# Patient Record
Sex: Female | Born: 1975 | Race: Asian | Hispanic: No | Marital: Married | Smoking: Never smoker
Health system: Southern US, Community
[De-identification: ages and names within clinical notes are randomized; demographics above are authoritative.]

---

## 2016-07-11 ENCOUNTER — Encounter: Payer: Self-pay | Admitting: *Deleted

## 2016-07-11 ENCOUNTER — Emergency Department (INDEPENDENT_AMBULATORY_CARE_PROVIDER_SITE_OTHER): Payer: Self-pay

## 2016-07-11 ENCOUNTER — Emergency Department
Admission: EM | Admit: 2016-07-11 | Discharge: 2016-07-11 | Disposition: A | Discharge status: Home / Self Care | Payer: Self-pay | Source: Home / Self Care | Attending: Family Medicine | Admitting: Family Medicine

## 2016-07-11 DIAGNOSIS — M25571 Pain in right ankle and joints of right foot: Secondary | ICD-10-CM

## 2016-07-11 DIAGNOSIS — S93601A Unspecified sprain of right foot, initial encounter: Secondary | ICD-10-CM

## 2016-07-11 DIAGNOSIS — M79671 Pain in right foot: Secondary | ICD-10-CM

## 2016-07-11 DIAGNOSIS — S93401A Sprain of unspecified ligament of right ankle, initial encounter: Secondary | ICD-10-CM

## 2016-07-11 NOTE — ED Provider Notes (Signed)
[facility]    CSN: [id number] Arrival date & time: 07/11/16  1056  First Provider Contact:  None       History   Chief Complaint Chief Complaint  Patient presents with  . Ankle Pain  . Foot Pain    HPI Kayla Brady is a 40 y.o. female.   Patient was walking down stairs about 3 hours ago when she missed the bottom stair and inverted her right foot/ankle.  She heard and felt a popping sound, and now has pain with weight-bearing   The history is provided by the patient.  Ankle Pain  Location:  Ankle and foot Time since incident:  3 hours Injury: yes   Mechanism of injury comment:  Twisted ankle/foot Ankle location:  R ankle Foot location:  R foot Pain details:    Quality:  Aching   Radiates to:  Does not radiate   Severity:  Moderate   Onset quality:  Sudden   Duration:  3 hours   Timing:  Constant   Progression:  Unchanged Chronicity:  New Dislocation: no   Foreign body present:  No foreign bodies Prior injury to area:  No Relieved by:  Nothing Worsened by:  Bearing weight Ineffective treatments:  Ice and NSAIDs Associated symptoms: decreased ROM, stiffness and swelling   Associated symptoms: no back pain, no muscle weakness, no numbness and no tingling   Foot Pain     History reviewed. No pertinent past medical history.  There are no active problems to display for this patient.   History reviewed. No pertinent surgical history.  OB History    No data available       Home Medications    Prior to Admission medications   Not on File    Family History History reviewed. No pertinent family history.  Social History Social History  Substance Use Topics  . Smoking status: Never Smoker  . Smokeless tobacco: Never Used  . Alcohol use Yes     Allergies   Review of patient's allergies indicates no known allergies.   Review of Systems Review of Systems  Musculoskeletal: Positive for stiffness. Negative for back pain.  All other  systems reviewed and are negative.    Physical Exam Triage Vital Signs ED Triage Vitals [07/11/16 1117]  Enc Vitals Group     BP 100/73     Pulse Rate 90     Resp      Temp      Temp src      SpO2 100 %     Weight      Height      Head Circumference      Peak Flow      Pain Score      Pain Loc      Pain Edu?      Excl. in GC?    No data found.   Updated Vital Signs BP 100/73 (BP Location: Left Arm)   Pulse 90   Ht 5\' 4"  (1.626 m)   Wt 112 lb (50.8 kg)   LMP 06/23/2016   SpO2 100%   BMI 19.22 kg/m   Visual Acuity Right Eye Distance:   Left Eye Distance:   Bilateral Distance:    Right Eye Near:   Left Eye Near:    Bilateral Near:     Physical Exam  Constitutional: She appears well-developed and well-nourished. No distress.  HENT:  Head: Atraumatic.  Eyes: Pupils are equal, round, and reactive to light.  Cardiovascular: Normal rate.   Pulmonary/Chest: Effort normal.  Musculoskeletal:       Right ankle: She exhibits decreased range of motion and swelling. She exhibits no ecchymosis, no deformity, no laceration and normal pulse. Tenderness. Lateral malleolus, posterior TFL, head of 5th metatarsal and proximal fibula tenderness found. No medial malleolus, no AITFL and no CF ligament tenderness found.       Right foot: There is tenderness and bony tenderness. There is normal range of motion, no swelling, normal capillary refill, no crepitus, no deformity and no laceration.       Feet:  Right ankle and foot have tenderness to palpation as noted on diagram.   Neurological: She is alert.  Skin: Skin is warm and dry.  Nursing note and vitals reviewed.    UC Treatments / Results  Labs (all labs ordered are listed, but only abnormal results are displayed) Labs Reviewed - No data to display  EKG  EKG Interpretation None       Radiology Dg Ankle Complete Right  Result Date: 07/11/2016 CLINICAL DATA:  Right lateral foot and ankle swelling after inversion  injury going down stairs this morning. EXAM: RIGHT ANKLE - COMPLETE 3+ VIEW COMPARISON:  None. FINDINGS: There is no evidence of fracture, dislocation, or joint effusion. There is no evidence of arthropathy or other focal bone abnormality. Soft tissues are unremarkable. IMPRESSION: Negative. Electronically Signed   By: Elberta Fortisaniel  Boyle M.D.   On: 07/11/2016 12:02   Dg Foot Complete Right  Result Date: 07/11/2016 CLINICAL DATA:  Right lateral foot and ankle pain after inversion injury EXAM: RIGHT FOOT COMPLETE - 3+ VIEW COMPARISON:  None. FINDINGS: There is no evidence of fracture or dislocation. There is no evidence of arthropathy or other focal bone abnormality. Soft tissues are unremarkable. Accessory os adjacent to the navicular bone. IMPRESSION: No definite acute osseous abnormality. Electronically Signed   By: Jasmine PangKim  Fujinaga M.D.   On: 07/11/2016 11:51    Procedures Procedures (including critical care time)  Medications Ordered in UC Medications - No data to display   Initial Impression / Assessment and Plan / UC Course  I have reviewed the triage vital signs and the nursing notes.  Pertinent labs & imaging results that were available during my care of the patient were reviewed by me and considered in my medical decision making (see chart for details).  Clinical Course   Patient declined cam walker. Apply ice pack for 30 minutes every 1 to 2 hours today and tomorrow.  Elevate.  Wear ankle brace for about 2 to 3 weeks.  Begin range of motion and stretching exercises in about 5 days as per instruction sheet.  May take Ibuprofen 200mg , 3 or 4 tabs every 8 hours with food.  Followup with [facility] if not improving about two weeks.     Final Clinical Impressions(s) / UC Diagnoses   Final diagnoses:  Right ankle sprain, initial encounter  Right foot sprain, initial encounter    New Prescriptions New Prescriptions   No medications on file     Lattie HawStephen A Beese, MD 07/11/16  1232

## 2016-07-11 NOTE — Discharge Instructions (Signed)
Apply ice pack for 30 minutes every 1 to 2 hours today and tomorrow.  Elevate.  Wear ankle brace for about 2 to 3 weeks.  Begin range of motion and stretching exercises in about 5 days as per instruction sheet.  May take Ibuprofen 200mg , 3 or 4 tabs every 8 hours with food.

## 2016-07-11 NOTE — ED Triage Notes (Signed)
Pt reports missing 1 step today, falling and rolling right foot/ankle and "hearing a crunch". No previous injuries.

## 2016-07-11 NOTE — ED Notes (Signed)
Pt declined cam walker boot

## 2018-02-27 IMAGING — DX DG ANKLE COMPLETE 3+V*R*
3 series · 3 of 3 positions shown · non-contrast
Comparison: None.

CLINICAL DATA: Right lateral foot and ankle swelling after
inversion injury going down stairs this morning.

EXAM:
RIGHT ANKLE - COMPLETE 3+ VIEW

[ankle ap]
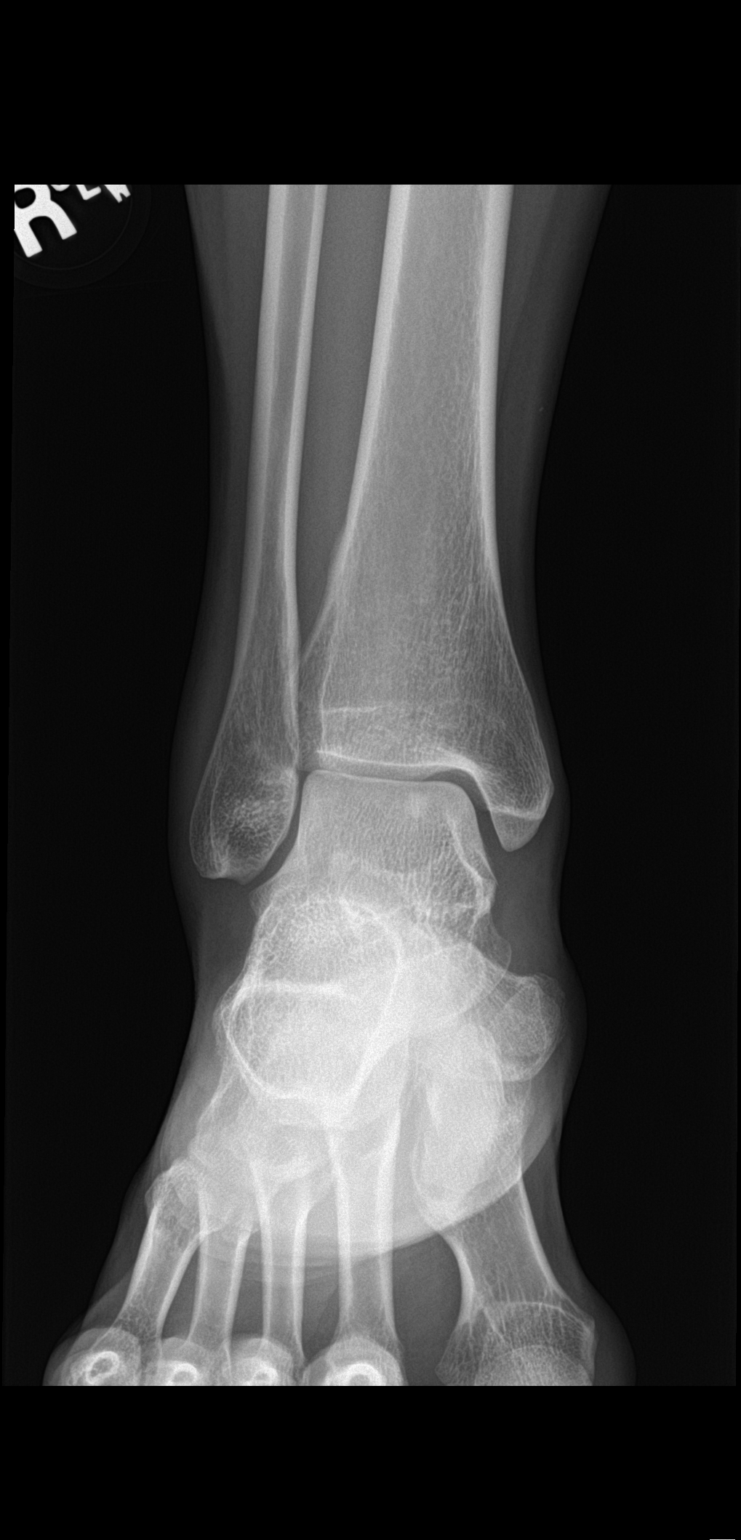

[ankle obl]
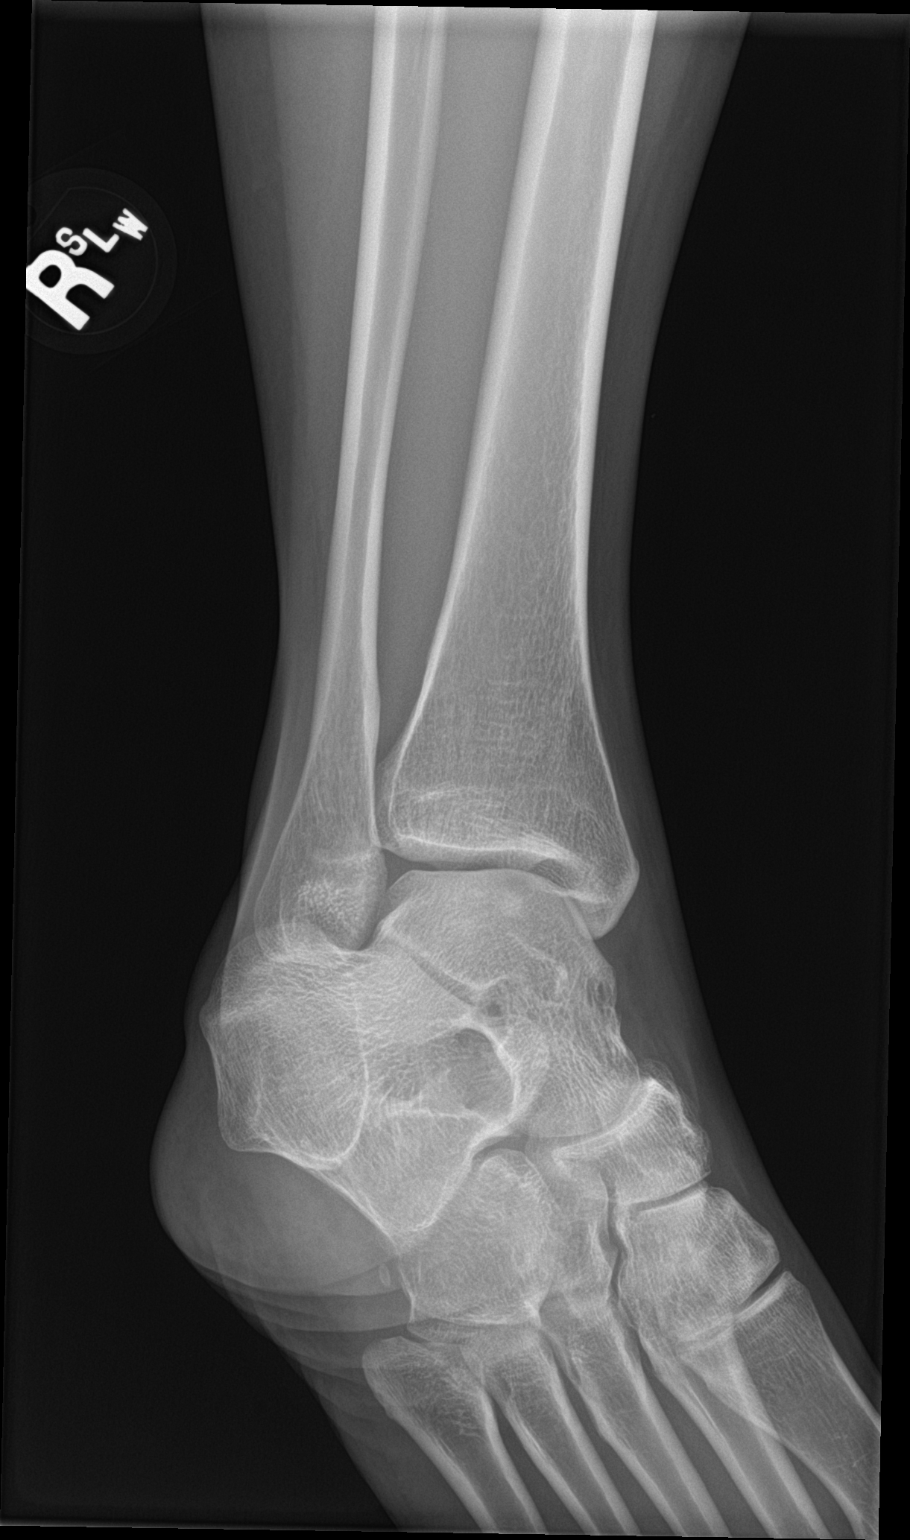

[ankle lat]
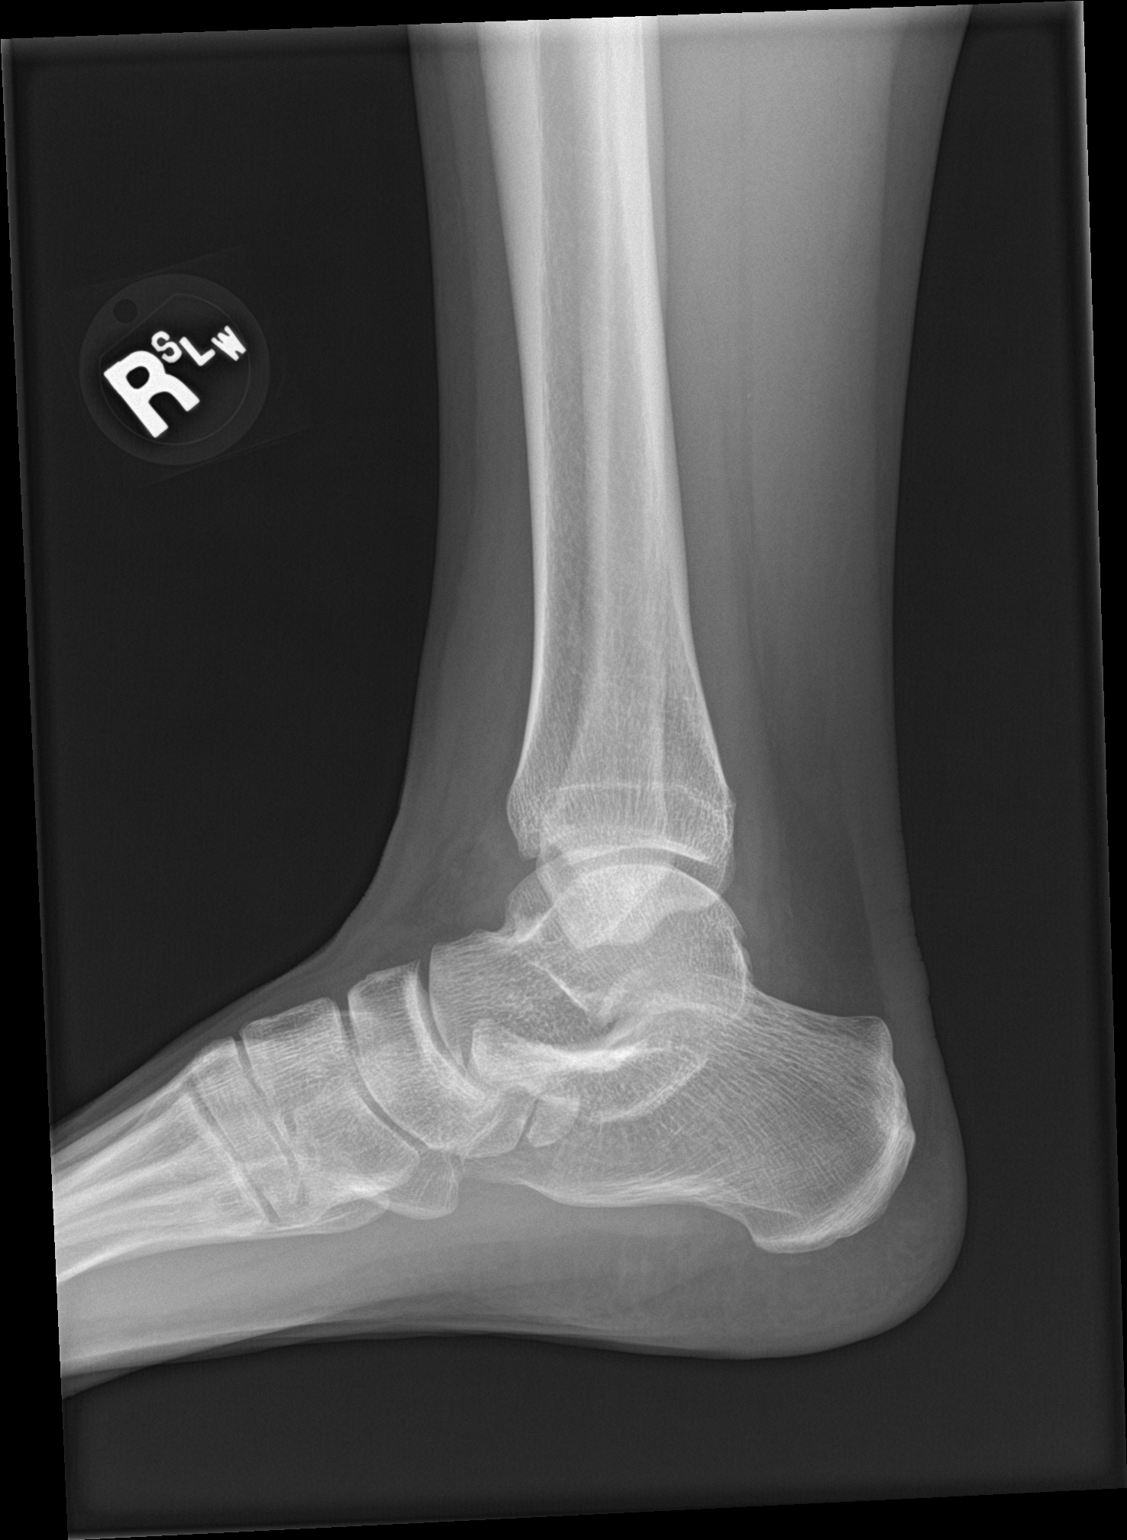

[3 of 3 positions shown; findings below may reference images not displayed]

FINDINGS: There is no evidence of fracture, dislocation, or joint effusion.
There is no evidence of arthropathy or other focal bone abnormality.
Soft tissues are unremarkable.
IMPRESSION: Negative.
